# Patient Record
Sex: Male | Born: 1950 | Race: White | Hispanic: No | Marital: Married | Smoking: Never smoker
Health system: Southern US, Community
[De-identification: ages and names within clinical notes are randomized; demographics above are authoritative.]

## PROBLEM LIST (undated history)

## (undated) DIAGNOSIS — M1990 Unspecified osteoarthritis, unspecified site: Secondary | ICD-10-CM

## (undated) DIAGNOSIS — R202 Paresthesia of skin: Secondary | ICD-10-CM

## (undated) DIAGNOSIS — E782 Mixed hyperlipidemia: Secondary | ICD-10-CM

## (undated) DIAGNOSIS — E039 Hypothyroidism, unspecified: Secondary | ICD-10-CM

## (undated) DIAGNOSIS — A6000 Herpesviral infection of urogenital system, unspecified: Secondary | ICD-10-CM

## (undated) DIAGNOSIS — N529 Male erectile dysfunction, unspecified: Secondary | ICD-10-CM

## (undated) DIAGNOSIS — H9193 Unspecified hearing loss, bilateral: Secondary | ICD-10-CM

## (undated) DIAGNOSIS — R7303 Prediabetes: Secondary | ICD-10-CM

## (undated) DIAGNOSIS — I10 Essential (primary) hypertension: Secondary | ICD-10-CM

## (undated) DIAGNOSIS — N189 Chronic kidney disease, unspecified: Secondary | ICD-10-CM

## (undated) HISTORY — DX: Male erectile dysfunction, unspecified: N52.9

## (undated) HISTORY — DX: Paresthesia of skin: R20.2

## (undated) HISTORY — DX: Hypothyroidism, unspecified: E03.9

## (undated) HISTORY — DX: Essential (primary) hypertension: I10

## (undated) HISTORY — PX: OTHER SURGICAL HISTORY: SHX169

## (undated) HISTORY — DX: Prediabetes: R73.03

## (undated) HISTORY — DX: Herpesviral infection of urogenital system, unspecified: A60.00

## (undated) HISTORY — DX: Unspecified hearing loss, bilateral: H91.93

## (undated) HISTORY — DX: Mixed hyperlipidemia: E78.2

## (undated) HISTORY — DX: Unspecified osteoarthritis, unspecified site: M19.90

## (undated) HISTORY — DX: Chronic kidney disease, unspecified: N18.9

---

## 2003-12-09 ENCOUNTER — Ambulatory Visit (HOSPITAL_COMMUNITY): Admission: RE | Admit: 2003-12-09 | Discharge: 2003-12-09 | Payer: Self-pay | Admitting: Gastroenterology

## 2003-12-09 HISTORY — PX: COLONOSCOPY: SHX174

## 2010-12-23 ENCOUNTER — Other Ambulatory Visit: Payer: Self-pay | Admitting: Family Medicine

## 2010-12-23 DIAGNOSIS — M5430 Sciatica, unspecified side: Secondary | ICD-10-CM

## 2010-12-23 DIAGNOSIS — M545 Low back pain: Secondary | ICD-10-CM

## 2010-12-24 ENCOUNTER — Other Ambulatory Visit: Payer: Self-pay

## 2013-12-24 HISTORY — PX: COLONOSCOPY: SHX174

## 2017-11-13 DIAGNOSIS — N529 Male erectile dysfunction, unspecified: Secondary | ICD-10-CM | POA: Diagnosis not present

## 2017-11-13 DIAGNOSIS — A6000 Herpesviral infection of urogenital system, unspecified: Secondary | ICD-10-CM | POA: Diagnosis not present

## 2017-11-13 DIAGNOSIS — Z23 Encounter for immunization: Secondary | ICD-10-CM | POA: Diagnosis not present

## 2017-11-13 DIAGNOSIS — Z125 Encounter for screening for malignant neoplasm of prostate: Secondary | ICD-10-CM | POA: Diagnosis not present

## 2017-11-13 DIAGNOSIS — R7303 Prediabetes: Secondary | ICD-10-CM | POA: Diagnosis not present

## 2017-11-13 DIAGNOSIS — E039 Hypothyroidism, unspecified: Secondary | ICD-10-CM | POA: Diagnosis not present

## 2017-11-13 DIAGNOSIS — Z1211 Encounter for screening for malignant neoplasm of colon: Secondary | ICD-10-CM | POA: Diagnosis not present

## 2017-11-13 DIAGNOSIS — Z0000 Encounter for general adult medical examination without abnormal findings: Secondary | ICD-10-CM | POA: Diagnosis not present

## 2017-11-13 DIAGNOSIS — E782 Mixed hyperlipidemia: Secondary | ICD-10-CM | POA: Diagnosis not present

## 2017-11-13 DIAGNOSIS — Z1389 Encounter for screening for other disorder: Secondary | ICD-10-CM | POA: Diagnosis not present

## 2017-11-13 DIAGNOSIS — Z136 Encounter for screening for cardiovascular disorders: Secondary | ICD-10-CM | POA: Diagnosis not present

## 2017-11-13 DIAGNOSIS — I10 Essential (primary) hypertension: Secondary | ICD-10-CM | POA: Diagnosis not present

## 2018-05-18 DIAGNOSIS — I10 Essential (primary) hypertension: Secondary | ICD-10-CM | POA: Diagnosis not present

## 2018-05-18 DIAGNOSIS — M1990 Unspecified osteoarthritis, unspecified site: Secondary | ICD-10-CM | POA: Diagnosis not present

## 2018-05-18 DIAGNOSIS — R7303 Prediabetes: Secondary | ICD-10-CM | POA: Diagnosis not present

## 2018-05-18 DIAGNOSIS — N529 Male erectile dysfunction, unspecified: Secondary | ICD-10-CM | POA: Diagnosis not present

## 2018-05-18 DIAGNOSIS — A6000 Herpesviral infection of urogenital system, unspecified: Secondary | ICD-10-CM | POA: Diagnosis not present

## 2018-05-18 DIAGNOSIS — E039 Hypothyroidism, unspecified: Secondary | ICD-10-CM | POA: Diagnosis not present

## 2018-05-18 DIAGNOSIS — E782 Mixed hyperlipidemia: Secondary | ICD-10-CM | POA: Diagnosis not present

## 2018-05-18 DIAGNOSIS — R202 Paresthesia of skin: Secondary | ICD-10-CM | POA: Diagnosis not present

## 2018-05-23 DIAGNOSIS — R7303 Prediabetes: Secondary | ICD-10-CM | POA: Diagnosis not present

## 2018-05-23 DIAGNOSIS — E782 Mixed hyperlipidemia: Secondary | ICD-10-CM | POA: Diagnosis not present

## 2018-05-23 DIAGNOSIS — E039 Hypothyroidism, unspecified: Secondary | ICD-10-CM | POA: Diagnosis not present

## 2018-09-11 DIAGNOSIS — M25512 Pain in left shoulder: Secondary | ICD-10-CM | POA: Diagnosis not present

## 2018-09-11 DIAGNOSIS — M25441 Effusion, right hand: Secondary | ICD-10-CM | POA: Diagnosis not present

## 2018-09-14 DIAGNOSIS — M65311 Trigger thumb, right thumb: Secondary | ICD-10-CM | POA: Diagnosis not present

## 2018-09-14 DIAGNOSIS — M75122 Complete rotator cuff tear or rupture of left shoulder, not specified as traumatic: Secondary | ICD-10-CM | POA: Diagnosis not present

## 2018-09-19 ENCOUNTER — Other Ambulatory Visit: Payer: Self-pay | Admitting: Orthopedic Surgery

## 2018-09-19 DIAGNOSIS — M67912 Unspecified disorder of synovium and tendon, left shoulder: Secondary | ICD-10-CM

## 2018-10-30 DIAGNOSIS — M75122 Complete rotator cuff tear or rupture of left shoulder, not specified as traumatic: Secondary | ICD-10-CM | POA: Diagnosis not present

## 2018-10-31 DIAGNOSIS — M75122 Complete rotator cuff tear or rupture of left shoulder, not specified as traumatic: Secondary | ICD-10-CM | POA: Diagnosis not present

## 2018-11-16 DIAGNOSIS — N529 Male erectile dysfunction, unspecified: Secondary | ICD-10-CM | POA: Diagnosis not present

## 2018-11-16 DIAGNOSIS — E039 Hypothyroidism, unspecified: Secondary | ICD-10-CM | POA: Diagnosis not present

## 2018-11-16 DIAGNOSIS — A6000 Herpesviral infection of urogenital system, unspecified: Secondary | ICD-10-CM | POA: Diagnosis not present

## 2018-11-16 DIAGNOSIS — I10 Essential (primary) hypertension: Secondary | ICD-10-CM | POA: Diagnosis not present

## 2018-11-16 DIAGNOSIS — E782 Mixed hyperlipidemia: Secondary | ICD-10-CM | POA: Diagnosis not present

## 2018-11-16 DIAGNOSIS — R7303 Prediabetes: Secondary | ICD-10-CM | POA: Diagnosis not present

## 2018-11-16 DIAGNOSIS — Z0000 Encounter for general adult medical examination without abnormal findings: Secondary | ICD-10-CM | POA: Diagnosis not present

## 2018-11-16 DIAGNOSIS — Z1211 Encounter for screening for malignant neoplasm of colon: Secondary | ICD-10-CM | POA: Diagnosis not present

## 2018-11-16 DIAGNOSIS — M1990 Unspecified osteoarthritis, unspecified site: Secondary | ICD-10-CM | POA: Diagnosis not present

## 2018-11-16 DIAGNOSIS — Z125 Encounter for screening for malignant neoplasm of prostate: Secondary | ICD-10-CM | POA: Diagnosis not present

## 2018-11-16 DIAGNOSIS — Z1389 Encounter for screening for other disorder: Secondary | ICD-10-CM | POA: Diagnosis not present

## 2018-11-30 DIAGNOSIS — E782 Mixed hyperlipidemia: Secondary | ICD-10-CM | POA: Diagnosis not present

## 2018-11-30 DIAGNOSIS — Z125 Encounter for screening for malignant neoplasm of prostate: Secondary | ICD-10-CM | POA: Diagnosis not present

## 2018-11-30 DIAGNOSIS — E039 Hypothyroidism, unspecified: Secondary | ICD-10-CM | POA: Diagnosis not present

## 2018-11-30 DIAGNOSIS — Z23 Encounter for immunization: Secondary | ICD-10-CM | POA: Diagnosis not present

## 2018-11-30 DIAGNOSIS — R7303 Prediabetes: Secondary | ICD-10-CM | POA: Diagnosis not present

## 2018-12-26 DIAGNOSIS — E782 Mixed hyperlipidemia: Secondary | ICD-10-CM | POA: Diagnosis not present

## 2018-12-26 DIAGNOSIS — M1990 Unspecified osteoarthritis, unspecified site: Secondary | ICD-10-CM | POA: Diagnosis not present

## 2018-12-26 DIAGNOSIS — I10 Essential (primary) hypertension: Secondary | ICD-10-CM | POA: Diagnosis not present

## 2018-12-26 DIAGNOSIS — E039 Hypothyroidism, unspecified: Secondary | ICD-10-CM | POA: Diagnosis not present

## 2019-04-16 DIAGNOSIS — E039 Hypothyroidism, unspecified: Secondary | ICD-10-CM | POA: Diagnosis not present

## 2019-04-16 DIAGNOSIS — E782 Mixed hyperlipidemia: Secondary | ICD-10-CM | POA: Diagnosis not present

## 2019-04-16 DIAGNOSIS — M1990 Unspecified osteoarthritis, unspecified site: Secondary | ICD-10-CM | POA: Diagnosis not present

## 2019-04-16 DIAGNOSIS — I10 Essential (primary) hypertension: Secondary | ICD-10-CM | POA: Diagnosis not present

## 2019-05-17 DIAGNOSIS — N529 Male erectile dysfunction, unspecified: Secondary | ICD-10-CM | POA: Diagnosis not present

## 2019-05-17 DIAGNOSIS — E782 Mixed hyperlipidemia: Secondary | ICD-10-CM | POA: Diagnosis not present

## 2019-05-17 DIAGNOSIS — I10 Essential (primary) hypertension: Secondary | ICD-10-CM | POA: Diagnosis not present

## 2019-05-17 DIAGNOSIS — R7303 Prediabetes: Secondary | ICD-10-CM | POA: Diagnosis not present

## 2019-05-17 DIAGNOSIS — A6000 Herpesviral infection of urogenital system, unspecified: Secondary | ICD-10-CM | POA: Diagnosis not present

## 2019-05-17 DIAGNOSIS — E039 Hypothyroidism, unspecified: Secondary | ICD-10-CM | POA: Diagnosis not present

## 2019-05-17 DIAGNOSIS — M1990 Unspecified osteoarthritis, unspecified site: Secondary | ICD-10-CM | POA: Diagnosis not present

## 2019-07-01 DIAGNOSIS — I10 Essential (primary) hypertension: Secondary | ICD-10-CM | POA: Diagnosis not present

## 2019-07-01 DIAGNOSIS — E782 Mixed hyperlipidemia: Secondary | ICD-10-CM | POA: Diagnosis not present

## 2019-07-01 DIAGNOSIS — M1990 Unspecified osteoarthritis, unspecified site: Secondary | ICD-10-CM | POA: Diagnosis not present

## 2019-07-01 DIAGNOSIS — E039 Hypothyroidism, unspecified: Secondary | ICD-10-CM | POA: Diagnosis not present

## 2019-07-04 DIAGNOSIS — E039 Hypothyroidism, unspecified: Secondary | ICD-10-CM | POA: Diagnosis not present

## 2019-07-19 DIAGNOSIS — H90A22 Sensorineural hearing loss, unilateral, left ear, with restricted hearing on the contralateral side: Secondary | ICD-10-CM | POA: Diagnosis not present

## 2019-07-19 DIAGNOSIS — H90A21 Sensorineural hearing loss, unilateral, right ear, with restricted hearing on the contralateral side: Secondary | ICD-10-CM | POA: Diagnosis not present

## 2019-11-29 DIAGNOSIS — I10 Essential (primary) hypertension: Secondary | ICD-10-CM | POA: Diagnosis not present

## 2019-11-29 DIAGNOSIS — Z1389 Encounter for screening for other disorder: Secondary | ICD-10-CM | POA: Diagnosis not present

## 2019-11-29 DIAGNOSIS — E039 Hypothyroidism, unspecified: Secondary | ICD-10-CM | POA: Diagnosis not present

## 2019-11-29 DIAGNOSIS — E782 Mixed hyperlipidemia: Secondary | ICD-10-CM | POA: Diagnosis not present

## 2019-11-29 DIAGNOSIS — Z20822 Contact with and (suspected) exposure to covid-19: Secondary | ICD-10-CM | POA: Diagnosis not present

## 2019-11-29 DIAGNOSIS — Z1211 Encounter for screening for malignant neoplasm of colon: Secondary | ICD-10-CM | POA: Diagnosis not present

## 2019-11-29 DIAGNOSIS — N529 Male erectile dysfunction, unspecified: Secondary | ICD-10-CM | POA: Diagnosis not present

## 2019-11-29 DIAGNOSIS — M1990 Unspecified osteoarthritis, unspecified site: Secondary | ICD-10-CM | POA: Diagnosis not present

## 2019-11-29 DIAGNOSIS — R7303 Prediabetes: Secondary | ICD-10-CM | POA: Diagnosis not present

## 2019-11-29 DIAGNOSIS — Z0000 Encounter for general adult medical examination without abnormal findings: Secondary | ICD-10-CM | POA: Diagnosis not present

## 2019-11-29 DIAGNOSIS — A6000 Herpesviral infection of urogenital system, unspecified: Secondary | ICD-10-CM | POA: Diagnosis not present

## 2019-11-29 DIAGNOSIS — Z125 Encounter for screening for malignant neoplasm of prostate: Secondary | ICD-10-CM | POA: Diagnosis not present

## 2020-01-14 DIAGNOSIS — R7303 Prediabetes: Secondary | ICD-10-CM | POA: Diagnosis not present

## 2020-01-14 DIAGNOSIS — Z125 Encounter for screening for malignant neoplasm of prostate: Secondary | ICD-10-CM | POA: Diagnosis not present

## 2020-01-14 DIAGNOSIS — E782 Mixed hyperlipidemia: Secondary | ICD-10-CM | POA: Diagnosis not present

## 2020-01-14 DIAGNOSIS — Z20822 Contact with and (suspected) exposure to covid-19: Secondary | ICD-10-CM | POA: Diagnosis not present

## 2020-02-17 DIAGNOSIS — N1832 Chronic kidney disease, stage 3b: Secondary | ICD-10-CM | POA: Diagnosis not present

## 2020-02-17 DIAGNOSIS — E782 Mixed hyperlipidemia: Secondary | ICD-10-CM | POA: Diagnosis not present

## 2020-02-17 DIAGNOSIS — M1990 Unspecified osteoarthritis, unspecified site: Secondary | ICD-10-CM | POA: Diagnosis not present

## 2020-02-17 DIAGNOSIS — E039 Hypothyroidism, unspecified: Secondary | ICD-10-CM | POA: Diagnosis not present

## 2020-02-17 DIAGNOSIS — I10 Essential (primary) hypertension: Secondary | ICD-10-CM | POA: Diagnosis not present

## 2020-04-23 DIAGNOSIS — E782 Mixed hyperlipidemia: Secondary | ICD-10-CM | POA: Diagnosis not present

## 2020-04-23 DIAGNOSIS — N1832 Chronic kidney disease, stage 3b: Secondary | ICD-10-CM | POA: Diagnosis not present

## 2020-04-23 DIAGNOSIS — E039 Hypothyroidism, unspecified: Secondary | ICD-10-CM | POA: Diagnosis not present

## 2020-04-23 DIAGNOSIS — M1990 Unspecified osteoarthritis, unspecified site: Secondary | ICD-10-CM | POA: Diagnosis not present

## 2020-04-23 DIAGNOSIS — I10 Essential (primary) hypertension: Secondary | ICD-10-CM | POA: Diagnosis not present

## 2020-06-01 DIAGNOSIS — R7303 Prediabetes: Secondary | ICD-10-CM | POA: Diagnosis not present

## 2020-06-01 DIAGNOSIS — R002 Palpitations: Secondary | ICD-10-CM | POA: Diagnosis not present

## 2020-06-01 DIAGNOSIS — I10 Essential (primary) hypertension: Secondary | ICD-10-CM | POA: Diagnosis not present

## 2020-06-01 DIAGNOSIS — E782 Mixed hyperlipidemia: Secondary | ICD-10-CM | POA: Diagnosis not present

## 2020-06-04 ENCOUNTER — Telehealth: Payer: Self-pay

## 2020-06-04 NOTE — Telephone Encounter (Signed)
Faxed notes to [facility] 

## 2020-06-29 ENCOUNTER — Encounter: Payer: Self-pay | Admitting: *Deleted

## 2020-07-08 NOTE — Progress Notes (Signed)
Input of information from records received by office

## 2020-07-09 ENCOUNTER — Ambulatory Visit: Payer: Self-pay | Admitting: Internal Medicine

## 2020-07-14 DIAGNOSIS — M1990 Unspecified osteoarthritis, unspecified site: Secondary | ICD-10-CM | POA: Diagnosis not present

## 2020-07-14 DIAGNOSIS — E039 Hypothyroidism, unspecified: Secondary | ICD-10-CM | POA: Diagnosis not present

## 2020-07-14 DIAGNOSIS — I10 Essential (primary) hypertension: Secondary | ICD-10-CM | POA: Diagnosis not present

## 2020-07-14 DIAGNOSIS — N1832 Chronic kidney disease, stage 3b: Secondary | ICD-10-CM | POA: Diagnosis not present

## 2020-07-14 DIAGNOSIS — E782 Mixed hyperlipidemia: Secondary | ICD-10-CM | POA: Diagnosis not present

## 2020-07-28 ENCOUNTER — Ambulatory Visit: Payer: Self-pay | Admitting: Cardiovascular Disease

## 2020-07-31 ENCOUNTER — Ambulatory Visit: Payer: Self-pay | Admitting: Cardiovascular Disease

## 2020-08-24 DIAGNOSIS — E782 Mixed hyperlipidemia: Secondary | ICD-10-CM | POA: Diagnosis not present

## 2020-08-24 DIAGNOSIS — E039 Hypothyroidism, unspecified: Secondary | ICD-10-CM | POA: Diagnosis not present

## 2020-08-24 DIAGNOSIS — I10 Essential (primary) hypertension: Secondary | ICD-10-CM | POA: Diagnosis not present

## 2020-08-24 DIAGNOSIS — M1990 Unspecified osteoarthritis, unspecified site: Secondary | ICD-10-CM | POA: Diagnosis not present

## 2020-08-24 DIAGNOSIS — N1832 Chronic kidney disease, stage 3b: Secondary | ICD-10-CM | POA: Diagnosis not present

## 2020-11-12 ENCOUNTER — Other Ambulatory Visit: Payer: Self-pay | Admitting: Sports Medicine

## 2020-11-12 ENCOUNTER — Ambulatory Visit
Admission: RE | Admit: 2020-11-12 | Discharge: 2020-11-12 | Disposition: A | Discharge status: Home / Self Care | Payer: PPO | Source: Ambulatory Visit | Attending: Sports Medicine | Admitting: Sports Medicine

## 2020-11-12 DIAGNOSIS — R102 Pelvic and perineal pain: Secondary | ICD-10-CM

## 2020-11-12 DIAGNOSIS — S76301A Unspecified injury of muscle, fascia and tendon of the posterior muscle group at thigh level, right thigh, initial encounter: Secondary | ICD-10-CM | POA: Diagnosis not present

## 2020-11-12 DIAGNOSIS — M7521 Bicipital tendinitis, right shoulder: Secondary | ICD-10-CM | POA: Diagnosis not present

## 2020-11-27 DIAGNOSIS — R7303 Prediabetes: Secondary | ICD-10-CM | POA: Diagnosis not present

## 2020-11-27 DIAGNOSIS — M25551 Pain in right hip: Secondary | ICD-10-CM | POA: Diagnosis not present

## 2020-11-27 DIAGNOSIS — E782 Mixed hyperlipidemia: Secondary | ICD-10-CM | POA: Diagnosis not present

## 2020-11-27 DIAGNOSIS — Z125 Encounter for screening for malignant neoplasm of prostate: Secondary | ICD-10-CM | POA: Diagnosis not present

## 2020-11-27 DIAGNOSIS — Z1389 Encounter for screening for other disorder: Secondary | ICD-10-CM | POA: Diagnosis not present

## 2020-11-27 DIAGNOSIS — I10 Essential (primary) hypertension: Secondary | ICD-10-CM | POA: Diagnosis not present

## 2020-11-27 DIAGNOSIS — N529 Male erectile dysfunction, unspecified: Secondary | ICD-10-CM | POA: Diagnosis not present

## 2020-11-27 DIAGNOSIS — M545 Low back pain, unspecified: Secondary | ICD-10-CM | POA: Diagnosis not present

## 2020-11-27 DIAGNOSIS — E039 Hypothyroidism, unspecified: Secondary | ICD-10-CM | POA: Diagnosis not present

## 2020-11-27 DIAGNOSIS — Z0000 Encounter for general adult medical examination without abnormal findings: Secondary | ICD-10-CM | POA: Diagnosis not present

## 2020-11-27 DIAGNOSIS — M1990 Unspecified osteoarthritis, unspecified site: Secondary | ICD-10-CM | POA: Diagnosis not present

## 2020-11-27 DIAGNOSIS — A6000 Herpesviral infection of urogenital system, unspecified: Secondary | ICD-10-CM | POA: Diagnosis not present

## 2021-06-08 DIAGNOSIS — M545 Low back pain, unspecified: Secondary | ICD-10-CM | POA: Diagnosis not present

## 2021-06-08 DIAGNOSIS — E039 Hypothyroidism, unspecified: Secondary | ICD-10-CM | POA: Diagnosis not present

## 2021-06-08 DIAGNOSIS — M1990 Unspecified osteoarthritis, unspecified site: Secondary | ICD-10-CM | POA: Diagnosis not present

## 2021-06-08 DIAGNOSIS — M25551 Pain in right hip: Secondary | ICD-10-CM | POA: Diagnosis not present

## 2021-06-08 DIAGNOSIS — A6000 Herpesviral infection of urogenital system, unspecified: Secondary | ICD-10-CM | POA: Diagnosis not present

## 2021-06-08 DIAGNOSIS — N529 Male erectile dysfunction, unspecified: Secondary | ICD-10-CM | POA: Diagnosis not present

## 2021-06-08 DIAGNOSIS — E782 Mixed hyperlipidemia: Secondary | ICD-10-CM | POA: Diagnosis not present

## 2021-06-08 DIAGNOSIS — R7303 Prediabetes: Secondary | ICD-10-CM | POA: Diagnosis not present

## 2021-06-08 DIAGNOSIS — H906 Mixed conductive and sensorineural hearing loss, bilateral: Secondary | ICD-10-CM | POA: Diagnosis not present

## 2021-06-08 DIAGNOSIS — I10 Essential (primary) hypertension: Secondary | ICD-10-CM | POA: Diagnosis not present

## 2022-03-25 DIAGNOSIS — R202 Paresthesia of skin: Secondary | ICD-10-CM | POA: Diagnosis not present

## 2022-03-25 DIAGNOSIS — I10 Essential (primary) hypertension: Secondary | ICD-10-CM | POA: Diagnosis not present

## 2022-03-25 DIAGNOSIS — Z1331 Encounter for screening for depression: Secondary | ICD-10-CM | POA: Diagnosis not present

## 2022-03-25 DIAGNOSIS — E039 Hypothyroidism, unspecified: Secondary | ICD-10-CM | POA: Diagnosis not present

## 2022-03-25 DIAGNOSIS — Z0000 Encounter for general adult medical examination without abnormal findings: Secondary | ICD-10-CM | POA: Diagnosis not present

## 2022-03-25 DIAGNOSIS — Z125 Encounter for screening for malignant neoplasm of prostate: Secondary | ICD-10-CM | POA: Diagnosis not present

## 2022-03-25 DIAGNOSIS — N529 Male erectile dysfunction, unspecified: Secondary | ICD-10-CM | POA: Diagnosis not present

## 2022-03-25 DIAGNOSIS — R7303 Prediabetes: Secondary | ICD-10-CM | POA: Diagnosis not present

## 2022-03-25 DIAGNOSIS — M25562 Pain in left knee: Secondary | ICD-10-CM | POA: Diagnosis not present

## 2022-03-25 DIAGNOSIS — E782 Mixed hyperlipidemia: Secondary | ICD-10-CM | POA: Diagnosis not present

## 2022-03-25 DIAGNOSIS — M25551 Pain in right hip: Secondary | ICD-10-CM | POA: Diagnosis not present

## 2022-03-25 DIAGNOSIS — A6000 Herpesviral infection of urogenital system, unspecified: Secondary | ICD-10-CM | POA: Diagnosis not present

## 2022-03-25 DIAGNOSIS — M545 Low back pain, unspecified: Secondary | ICD-10-CM | POA: Diagnosis not present

## 2022-06-16 IMAGING — DX DG PELVIS 1-2V
2 series · 2 of 2 positions shown · non-contrast
Comparison: None.

CLINICAL DATA: Pelvic pain. Technologist notes state fall water
skiing in [REDACTED] and tore right hamstring. Persistent right lower
buttock pain. Rule out avulsion fracture.

EXAM:
PELVIS - 1-2 VIEW

[dg pelvis 1-2 views]
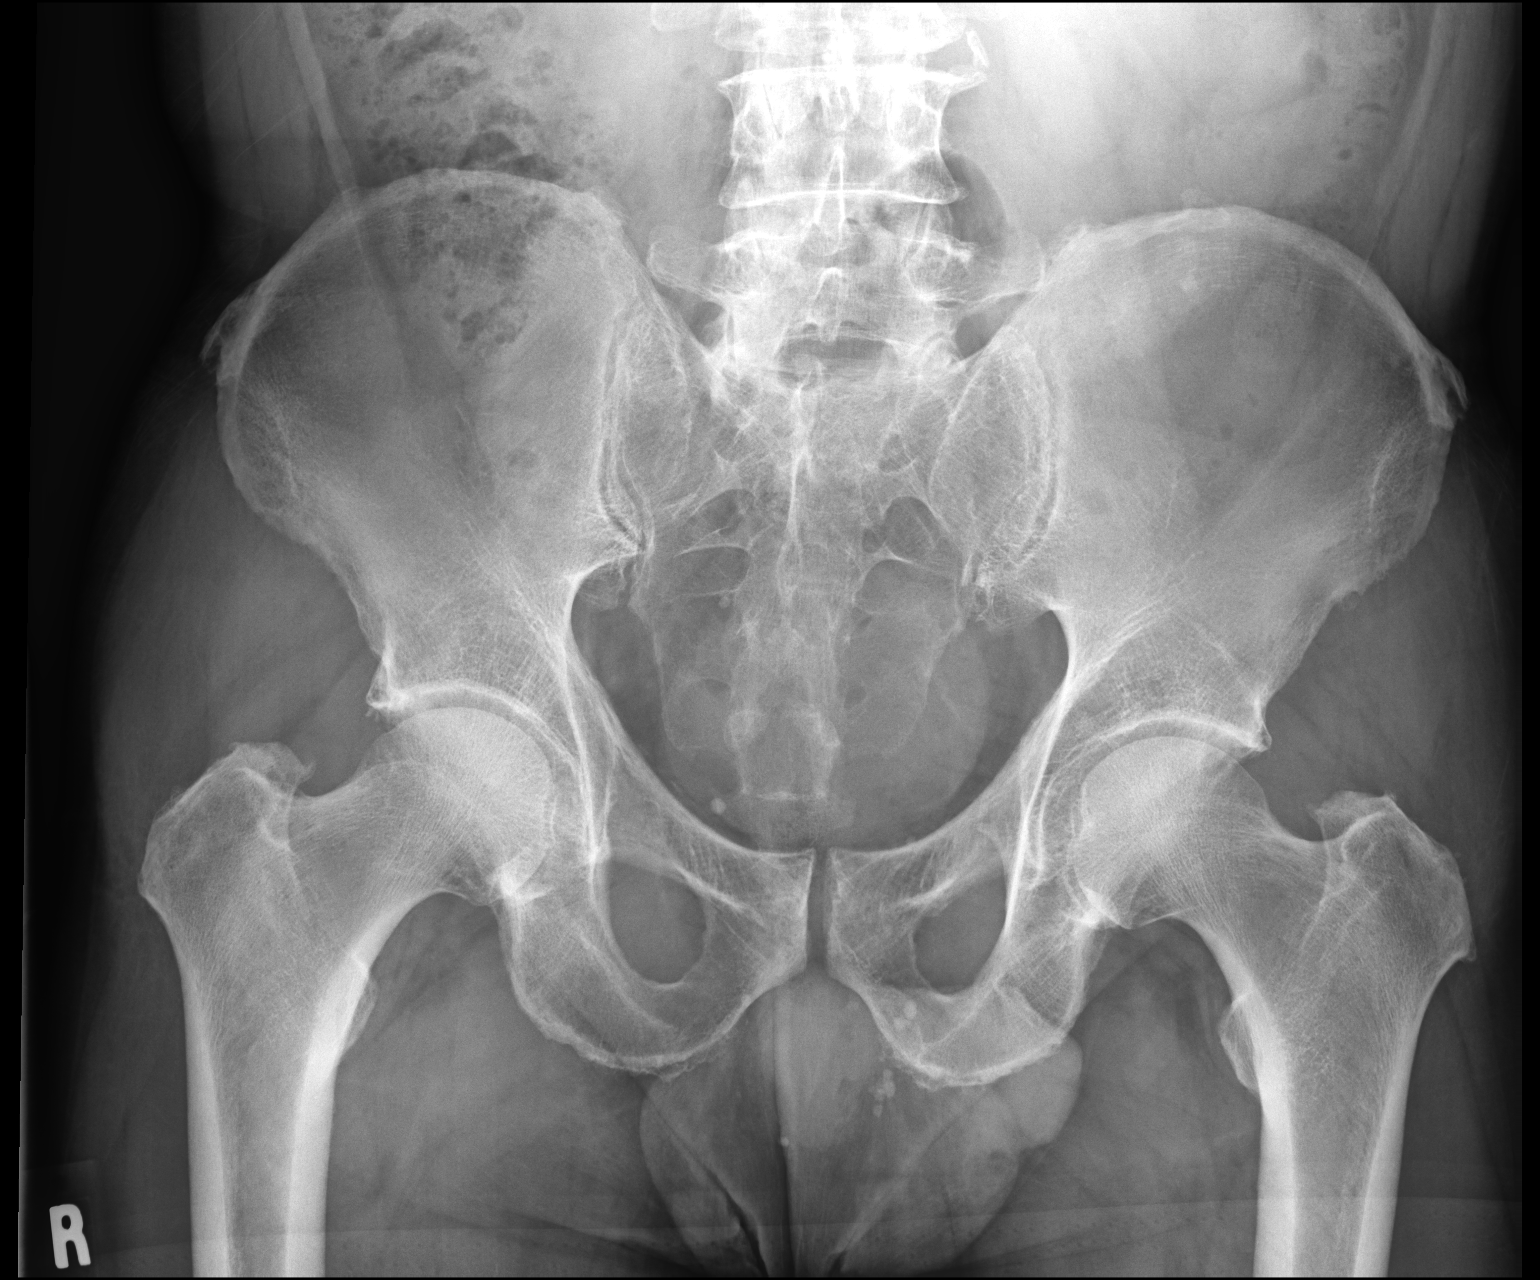

[view not recorded]
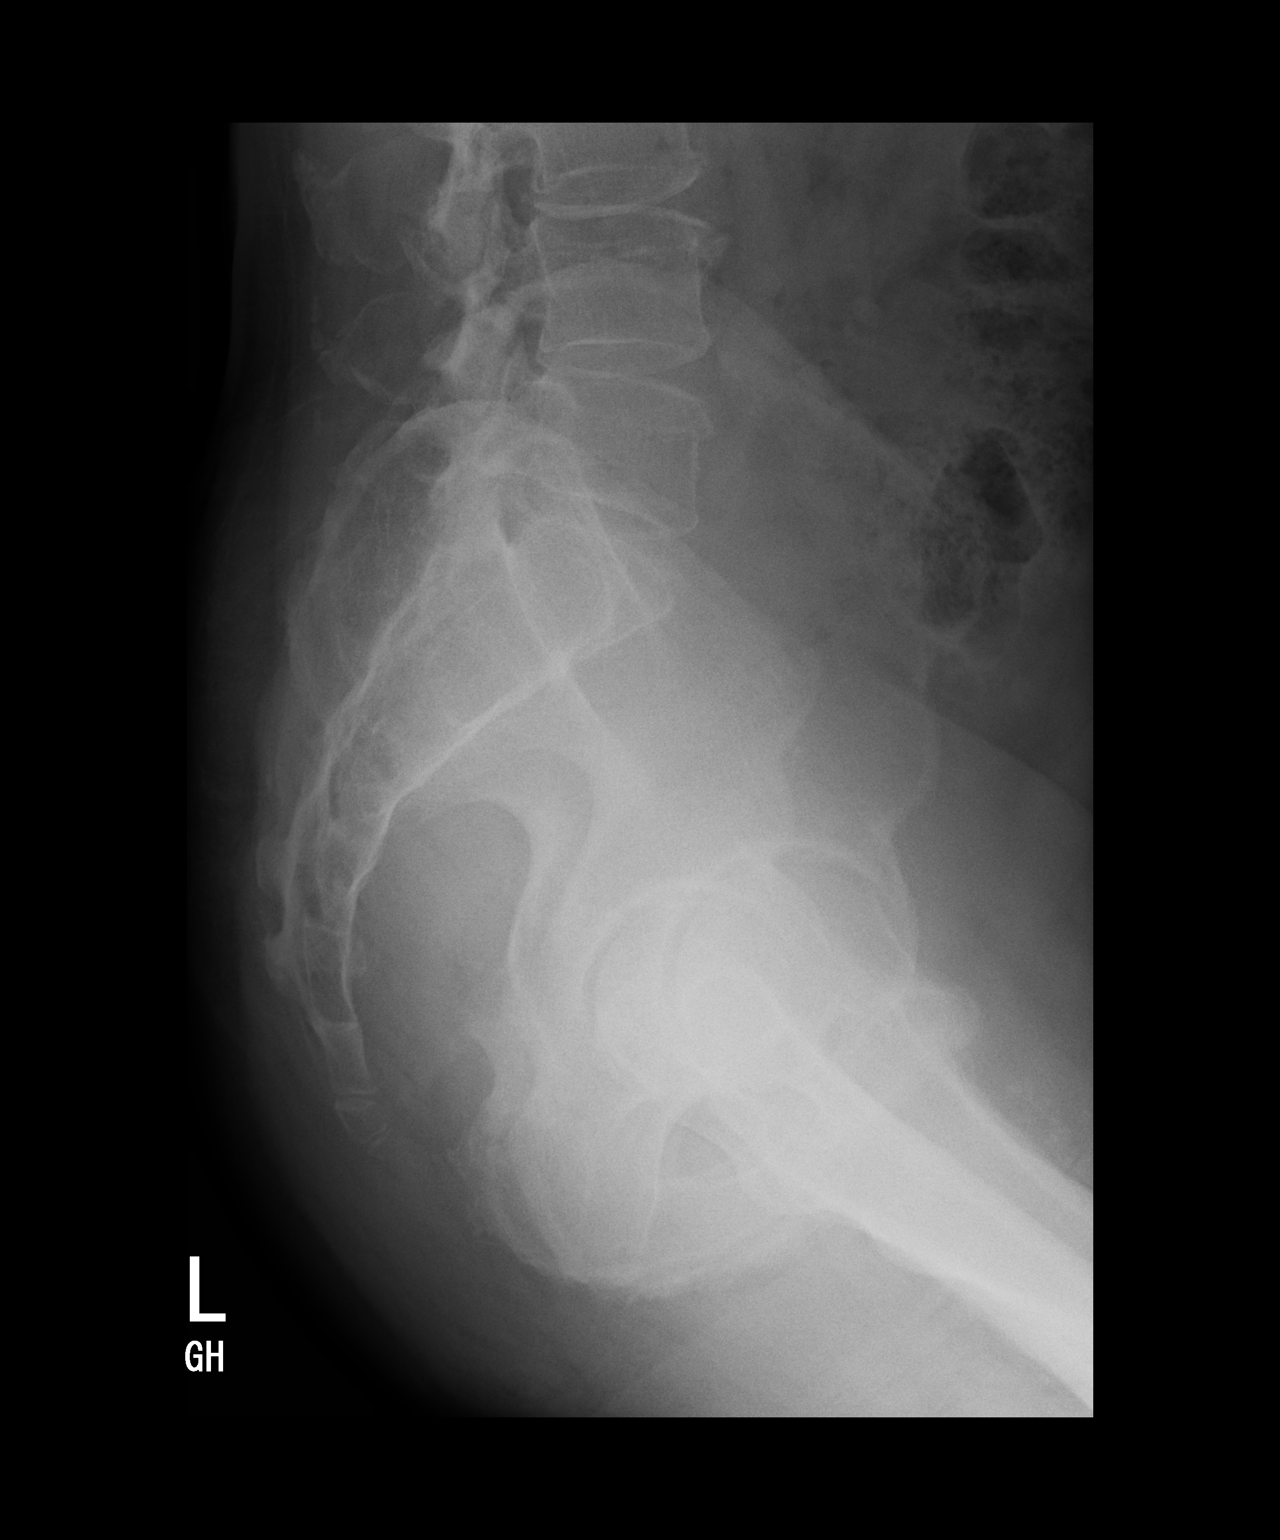

[2 of 2 positions shown; findings below may reference images not displayed]

FINDINGS: Cortical margins of the bony pelvis are intact. Pubic rami are
intact without evidence of avulsion fracture or injury. No acute or
healing fracture is seen. Both femoral heads are well seated in the
respective acetabula. Minimal bilateral hip degenerative change.
Pubic symphysis and sacroiliac joints are congruent. Scattered
pelvic phleboliths. Otherwise unremarkable soft tissues.
IMPRESSION: 1. No evidence of avulsion injury, acute or healing fracture.
2. Minimal bilateral hip degenerative change.

## 2022-10-05 DIAGNOSIS — M25552 Pain in left hip: Secondary | ICD-10-CM | POA: Diagnosis not present

## 2022-10-05 DIAGNOSIS — M25551 Pain in right hip: Secondary | ICD-10-CM | POA: Diagnosis not present

## 2022-10-05 DIAGNOSIS — M25562 Pain in left knee: Secondary | ICD-10-CM | POA: Diagnosis not present

## 2022-10-05 DIAGNOSIS — M25561 Pain in right knee: Secondary | ICD-10-CM | POA: Diagnosis not present

## 2022-10-06 DIAGNOSIS — E039 Hypothyroidism, unspecified: Secondary | ICD-10-CM | POA: Diagnosis not present

## 2022-10-06 DIAGNOSIS — R7303 Prediabetes: Secondary | ICD-10-CM | POA: Diagnosis not present

## 2022-10-06 DIAGNOSIS — M545 Low back pain, unspecified: Secondary | ICD-10-CM | POA: Diagnosis not present

## 2022-10-06 DIAGNOSIS — I10 Essential (primary) hypertension: Secondary | ICD-10-CM | POA: Diagnosis not present

## 2022-10-06 DIAGNOSIS — M170 Bilateral primary osteoarthritis of knee: Secondary | ICD-10-CM | POA: Diagnosis not present

## 2022-10-06 DIAGNOSIS — A6000 Herpesviral infection of urogenital system, unspecified: Secondary | ICD-10-CM | POA: Diagnosis not present

## 2022-10-06 DIAGNOSIS — E782 Mixed hyperlipidemia: Secondary | ICD-10-CM | POA: Diagnosis not present

## 2022-10-06 DIAGNOSIS — N529 Male erectile dysfunction, unspecified: Secondary | ICD-10-CM | POA: Diagnosis not present

## 2022-10-06 DIAGNOSIS — N1832 Chronic kidney disease, stage 3b: Secondary | ICD-10-CM | POA: Diagnosis not present

## 2022-10-06 DIAGNOSIS — M160 Bilateral primary osteoarthritis of hip: Secondary | ICD-10-CM | POA: Diagnosis not present

## 2022-10-25 DIAGNOSIS — M5451 Vertebrogenic low back pain: Secondary | ICD-10-CM | POA: Diagnosis not present

## 2022-11-25 DIAGNOSIS — M5451 Vertebrogenic low back pain: Secondary | ICD-10-CM | POA: Diagnosis not present

## 2022-11-30 DIAGNOSIS — M5451 Vertebrogenic low back pain: Secondary | ICD-10-CM | POA: Diagnosis not present

## 2022-12-07 DIAGNOSIS — M5451 Vertebrogenic low back pain: Secondary | ICD-10-CM | POA: Diagnosis not present

## 2023-07-24 DIAGNOSIS — Z125 Encounter for screening for malignant neoplasm of prostate: Secondary | ICD-10-CM | POA: Diagnosis not present

## 2023-07-24 DIAGNOSIS — E039 Hypothyroidism, unspecified: Secondary | ICD-10-CM | POA: Diagnosis not present

## 2023-07-24 DIAGNOSIS — M1990 Unspecified osteoarthritis, unspecified site: Secondary | ICD-10-CM | POA: Diagnosis not present

## 2023-07-24 DIAGNOSIS — R202 Paresthesia of skin: Secondary | ICD-10-CM | POA: Diagnosis not present

## 2023-07-24 DIAGNOSIS — N529 Male erectile dysfunction, unspecified: Secondary | ICD-10-CM | POA: Diagnosis not present

## 2023-07-24 DIAGNOSIS — E782 Mixed hyperlipidemia: Secondary | ICD-10-CM | POA: Diagnosis not present

## 2023-07-24 DIAGNOSIS — Z0000 Encounter for general adult medical examination without abnormal findings: Secondary | ICD-10-CM | POA: Diagnosis not present

## 2023-07-24 DIAGNOSIS — I10 Essential (primary) hypertension: Secondary | ICD-10-CM | POA: Diagnosis not present

## 2023-07-24 DIAGNOSIS — A6000 Herpesviral infection of urogenital system, unspecified: Secondary | ICD-10-CM | POA: Diagnosis not present

## 2023-07-24 DIAGNOSIS — R7303 Prediabetes: Secondary | ICD-10-CM | POA: Diagnosis not present

## 2023-07-24 DIAGNOSIS — M545 Low back pain, unspecified: Secondary | ICD-10-CM | POA: Diagnosis not present

## 2023-07-24 DIAGNOSIS — Z1331 Encounter for screening for depression: Secondary | ICD-10-CM | POA: Diagnosis not present
